# Patient Record
Sex: Male | Born: 1950
Health system: Southern US, Community
[De-identification: ages and names within clinical notes are randomized; demographics above are authoritative.]

## PROBLEM LIST (undated history)

## (undated) DIAGNOSIS — I10 Essential (primary) hypertension: Secondary | ICD-10-CM

## (undated) DIAGNOSIS — R51 Headache: Secondary | ICD-10-CM

## (undated) DIAGNOSIS — S83209A Unspecified tear of unspecified meniscus, current injury, unspecified knee, initial encounter: Secondary | ICD-10-CM

## (undated) HISTORY — PX: DG BIOPSY LUNG: HXRAD146

---

## 2002-09-13 ENCOUNTER — Ambulatory Visit (HOSPITAL_COMMUNITY): Admission: RE | Admit: 2002-09-13 | Discharge: 2002-09-13 | Payer: Self-pay | Admitting: General Surgery

## 2008-05-26 ENCOUNTER — Emergency Department (HOSPITAL_COMMUNITY): Admission: EM | Admit: 2008-05-26 | Discharge: 2008-05-26 | Payer: Self-pay | Admitting: Emergency Medicine

## 2011-04-22 NOTE — H&P (Signed)
   NAME:  Luis Munoz, Luis L.                      ACCOUNT NO.:  [id number]   MEDICAL RECORD NO.:  [id number]                  PATIENT TYPE:   LOCATION:                                       FACILITY:  [facility]   PHYSICIAN:  Jerolyn Shin C. Katrinka Blazing, M.D.                DATE OF BIRTH:   DATE OF ADMISSION:  DATE OF DISCHARGE:                                HISTORY & PHYSICAL   HISTORY OF PRESENT ILLNESS:  [age]-year-old male with history of rectal  bleeding.  He has had four episodes of rectal bleeding over the past two  weeks.  He has no pain with bowel movements.  He has no abdominal pain.  He  denies nausea or vomiting.  He is scheduled for a colonoscopy.   PAST HISTORY:  He has a history of T-cell lymphoma which was diagnosed in  69.  He underwent chemotherapy and has been totally recovered since that  time.  He has no other medical illness.   ALLERGIES:  He has no known drug allergies.   FAMILY HISTORY:  Family history is positive for hypertension and diabetes.   PHYSICAL EXAMINATION:  VITAL SIGNS:  Blood pressure 133/84, pulse 78,  respirations 18.  Weight 262 pounds.  HEENT:  Unremarkable.  NECK:  Neck supple without JVD or bruit.  No thyromegaly, adenopathy or  jugular venous distention.  CHEST:  Chest clear to auscultation.  No rales, rubs, rhonchi or wheezes.  HEART:  Regular rate and rhythm without murmur, gallop or rub.  ABDOMEN:  Abdomen is soft, nontender, no masses.  RECTAL:  Normal rectal exam.  No masses.  Stool is guaiac-positive.  EXTREMITIES:  No cyanosis, clubbing or edema.  NEUROLOGIC:  No focal motor, sensory or cerebellar deficits.  LYMPHATIC:  Lymph node exam reveals no lymph nodes in the neck,  supraclavicular area, submental area, axillae, inguinal, epitrochlear or  popliteal areas.   IMPRESSION:  1. Recurrent rectal bleeding.  2. Prior history of T-cell lymphoma, recovered.   PLAN:  Colonoscopy.                                               Dirk Dress.  Katrinka Blazing, M.D.    LCS/MEDQ  D:  09/12/2002  T:  09/13/2002  Job:  [id number]

## 2011-09-01 LAB — DIFFERENTIAL
Basophils Absolute: 0.1
Basophils Relative: 0
Eosinophils Absolute: 0
Monocytes Relative: 3
Neutro Abs: 12.3 — ABNORMAL HIGH
Neutrophils Relative %: 86 — ABNORMAL HIGH

## 2011-09-01 LAB — BASIC METABOLIC PANEL
BUN: 6
CO2: 26
Calcium: 9.2
Chloride: 104
Creatinine, Ser: 0.99
GFR calc Af Amer: >60

## 2011-09-01 LAB — POCT CARDIAC MARKERS
CKMB, poc: 2.7
Myoglobin, poc: 76.3
Operator id: 267321

## 2011-09-01 LAB — CBC
MCHC: 33.9
MCV: 90.5
Platelets: 279
RBC: 4.68
RDW: 12.7

## 2011-10-14 ENCOUNTER — Other Ambulatory Visit: Payer: Self-pay | Admitting: Orthopedic Surgery

## 2011-10-14 DIAGNOSIS — S83249A Other tear of medial meniscus, current injury, unspecified knee, initial encounter: Secondary | ICD-10-CM | POA: Insufficient documentation

## 2011-10-17 ENCOUNTER — Other Ambulatory Visit: Payer: Self-pay | Admitting: Orthopedic Surgery

## 2011-10-17 ENCOUNTER — Encounter (HOSPITAL_BASED_OUTPATIENT_CLINIC_OR_DEPARTMENT_OTHER): Payer: Self-pay | Admitting: *Deleted

## 2011-10-17 NOTE — H&P (Signed)
  Luis Munoz 10/11/2011 9:00 AM Location: [facility]  DOB: 09/20/1961 Married / Language: Undefined / Race: White Male   History of Present Illness The patient is a 60 year old male who presents today for follow up of their knee. The patient is being followed for their left knee pain. They are now 3 month(s) out from when symptoms began. Symptoms reported today include: pain, swelling, aching and stiffness. The patient feels that they are doing well and report their pain level to be moderate. The patient presents today following MRI. The patient has reported improvement of their symptoms with: rest.    Problem List/Past Medical Acute Medial Meniscal Tear (836.0) Pain in joint, lower leg (719.46). 05/13/2011 Osteoarthrosis NOS, lower leg (715.96). 05/13/2011   Allergies; No Known Drug Allergies. 09/27/2011   Social History Tobacco use. occasionally No alcohol use   Medication History; 10/11/2011 9:00 AM) Norco (5-325MG  Tablet, 1 (one) Oral every 4-6 hours as needed for pain, Taken starting 10/06/2011) Active. Niaspan (1000MG  Tablet ER, Oral) Active. Losartan Potassium-HCTZ (50-12.5MG  Tablet, Oral) Active. Vimovo (500-20MG  Tablet DR, Oral) Active. Dolgic Plus ( Oral) Specific dose unknown - Active. Claritin (10MG  Capsule, Oral) Active.   Other Problems) Hypertension   Objective Transcription(Luis Munoz A Luis Shultis, MD; 10/13/2011 8:48 AM)  On physical exam of his knee, he still has pain. He has a genu varus. He has minimal knee effusion. The popliteal space is fine. Cruciate and collateral ligaments are intact. Patella tracks well midline. He has painful knee. Calf soft and nontender. No phlebitis. Circulation intact.    RADIOGRAPHS:  I reviewed the MRI, reviewed the report.    Showed them the knee model and went over all possible complications such as infection, blood clots, etc. He will be on antibiotics right before surgery when  we bring him into outpatient. He is going to buy a bottle of aspirin 325 mg and he will take two for two weeks after surgery as a blood thinner. He will be on crutches partial weightbearing.      Assessment & Plan(Luis Munoz A Luis Otten, MD; 10/11/2011 9:18 AM) Acute Medial Meniscal Tear (836.0) Current Plans l Follow up as needed    Plans Transcription(Luis Munoz A Luis Treloar, MD; 10/13/2011 8:48 AM)  The patient needs a left knee arthroscopic medial and lateral meniscectomy. He also may need to do a clean out, but we will decide that at the time of surgery. I discussed the arthritis with him and explained that we may not be able to give him total pain relief, but we will not known that until we look into the knee joint. We will do it as an outpatient.      Miscellaneous Transcription(Luis Munoz A Luis Sakamoto, MD; 10/13/2011 8:48 AM)  Luis Cones, MD/sr

## 2011-10-17 NOTE — Progress Notes (Signed)
LM on cell [id number]. NPO after MN. Pt to arrive at 1030. Will need hx done. Poss. ekg and istat.

## 2011-10-18 ENCOUNTER — Encounter (HOSPITAL_BASED_OUTPATIENT_CLINIC_OR_DEPARTMENT_OTHER): Payer: Self-pay | Admitting: Anesthesiology

## 2011-10-18 ENCOUNTER — Ambulatory Visit (HOSPITAL_BASED_OUTPATIENT_CLINIC_OR_DEPARTMENT_OTHER): Payer: BC Managed Care – PPO | Admitting: Anesthesiology

## 2011-10-18 ENCOUNTER — Ambulatory Visit (HOSPITAL_BASED_OUTPATIENT_CLINIC_OR_DEPARTMENT_OTHER)
Admission: RE | Admit: 2011-10-18 | Discharge: 2011-10-18 | Disposition: A | Discharge status: Home / Self Care | Payer: BC Managed Care – PPO | Source: Ambulatory Visit | Attending: Orthopedic Surgery | Admitting: Orthopedic Surgery

## 2011-10-18 ENCOUNTER — Other Ambulatory Visit: Payer: Self-pay

## 2011-10-18 ENCOUNTER — Encounter (HOSPITAL_BASED_OUTPATIENT_CLINIC_OR_DEPARTMENT_OTHER)
Admission: RE | Disposition: A | Discharge status: Home / Self Care | Payer: Self-pay | Source: Ambulatory Visit | Attending: Orthopedic Surgery

## 2011-10-18 ENCOUNTER — Encounter (HOSPITAL_BASED_OUTPATIENT_CLINIC_OR_DEPARTMENT_OTHER): Payer: Self-pay | Admitting: *Deleted

## 2011-10-18 ENCOUNTER — Ambulatory Visit (HOSPITAL_COMMUNITY): Payer: BC Managed Care – PPO

## 2011-10-18 DIAGNOSIS — X58XXXA Exposure to other specified factors, initial encounter: Secondary | ICD-10-CM | POA: Insufficient documentation

## 2011-10-18 DIAGNOSIS — Z01818 Encounter for other preprocedural examination: Secondary | ICD-10-CM | POA: Insufficient documentation

## 2011-10-18 DIAGNOSIS — Z79899 Other long term (current) drug therapy: Secondary | ICD-10-CM | POA: Insufficient documentation

## 2011-10-18 DIAGNOSIS — S83289A Other tear of lateral meniscus, current injury, unspecified knee, initial encounter: Secondary | ICD-10-CM | POA: Insufficient documentation

## 2011-10-18 DIAGNOSIS — M1710 Unilateral primary osteoarthritis, unspecified knee: Secondary | ICD-10-CM | POA: Insufficient documentation

## 2011-10-18 DIAGNOSIS — I10 Essential (primary) hypertension: Secondary | ICD-10-CM | POA: Insufficient documentation

## 2011-10-18 DIAGNOSIS — IMO0002 Reserved for concepts with insufficient information to code with codable children: Secondary | ICD-10-CM | POA: Insufficient documentation

## 2011-10-18 DIAGNOSIS — M25569 Pain in unspecified knee: Secondary | ICD-10-CM | POA: Insufficient documentation

## 2011-10-18 HISTORY — DX: Unspecified tear of unspecified meniscus, current injury, unspecified knee, initial encounter: S83.209A

## 2011-10-18 HISTORY — DX: Essential (primary) hypertension: I10

## 2011-10-18 HISTORY — DX: Headache: R51

## 2011-10-18 LAB — DIFFERENTIAL
Eosinophils Absolute: 0.4 10*3/uL (ref 0.0–0.7)
Eosinophils Relative: 5 % (ref 0–5)
Lymphocytes Relative: 33 % (ref 12–46)
Lymphs Abs: 2.8 10*3/uL (ref 0.7–4.0)
Monocytes Relative: 5 % (ref 3–12)

## 2011-10-18 LAB — CBC
MCH: 29.7 pg (ref 26.0–34.0)
MCHC: 32.8 g/dL (ref 30.0–36.0)
MCV: 90.3 fL (ref 78.0–100.0)
Platelets: 291 10*3/uL (ref 150–400)
RBC: 4.35 MIL/uL (ref 4.22–5.81)
RDW: 12.8 % (ref 11.5–15.5)

## 2011-10-18 LAB — COMPREHENSIVE METABOLIC PANEL
Alkaline Phosphatase: 75 U/L (ref 39–117)
BUN: 8 mg/dL (ref 6–23)
CO2: 28 mEq/L (ref 19–32)
Calcium: 9.2 mg/dL (ref 8.4–10.5)
GFR calc Af Amer: >90 mL/min (ref >90)
GFR calc non Af Amer: 89 mL/min — ABNORMAL LOW (ref >90)
Glucose, Bld: 110 mg/dL — ABNORMAL HIGH (ref 70–99)
Potassium: 4 mEq/L (ref 3.5–5.1)
Total Protein: 7.6 g/dL (ref 6.0–8.3)

## 2011-10-18 LAB — URINALYSIS, ROUTINE W REFLEX MICROSCOPIC
Ketones, ur: NEGATIVE mg/dL
Leukocytes, UA: NEGATIVE
Nitrite: NEGATIVE
Urobilinogen, UA: 1 mg/dL (ref 0.0–1.0)
pH: 6.5 (ref 5.0–8.0)

## 2011-10-18 SURGERY — ARTHROSCOPY, KNEE, WITH MEDIAL MENISCECTOMY
Anesthesia: General | Site: Knee | Laterality: Left | Wound class: Clean

## 2011-10-18 MED ORDER — SODIUM CHLORIDE 0.9 % IJ SOLN: 3.0000 mL | INTRAMUSCULAR | Status: DC | PRN

## 2011-10-18 MED ORDER — LIDOCAINE HCL (CARDIAC) 20 MG/ML IV SOLN
INTRAVENOUS | Status: DC | PRN
  Administered 2011-10-18: 100 mg via INTRAVENOUS

## 2011-10-18 MED ORDER — ASPIRIN 325 MG PO TABS: 325.0000 mg | ORAL_TABLET | Freq: Two times a day (BID) | ORAL | Status: AC

## 2011-10-18 MED ORDER — BUPIVACAINE-EPINEPHRINE 0.25% -1:200000 IJ SOLN
INTRAMUSCULAR | Status: DC | PRN
  Administered 2011-10-18: 30 mL

## 2011-10-18 MED ORDER — OXYCODONE-ACETAMINOPHEN 10-325 MG PO TABS: 1.0000 | ORAL_TABLET | ORAL | Status: AC | PRN

## 2011-10-18 MED ORDER — PROPOFOL 10 MG/ML IV EMUL
INTRAVENOUS | Status: DC | PRN
  Administered 2011-10-18: 200 mg via INTRAVENOUS

## 2011-10-18 MED ORDER — PROMETHAZINE HCL 25 MG/ML IJ SOLN: 6.2500 mg | INTRAMUSCULAR | Status: DC | PRN

## 2011-10-18 MED ORDER — SODIUM CHLORIDE 0.9 % IR SOLN
Status: DC | PRN
  Administered 2011-10-18: 3

## 2011-10-18 MED ORDER — ASPIRIN 325 MG PO TABS: 325.0000 mg | ORAL_TABLET | Freq: Two times a day (BID) | ORAL | Status: DC

## 2011-10-18 MED ORDER — RINGERS IV SOLN: INTRAVENOUS | Status: DC

## 2011-10-18 MED ORDER — SODIUM CHLORIDE 0.9 % IJ SOLN: 3.0000 mL | Freq: Two times a day (BID) | INTRAMUSCULAR | Status: DC

## 2011-10-18 MED ORDER — CEFAZOLIN SODIUM 1-5 GM-% IV SOLN
1.0000 g | INTRAVENOUS | Status: AC
  Administered 2011-10-18: 2 g via INTRAVENOUS

## 2011-10-18 MED ORDER — ONDANSETRON HCL 4 MG/2ML IJ SOLN
INTRAMUSCULAR | Status: DC | PRN
  Administered 2011-10-18: 4 mg via INTRAVENOUS

## 2011-10-18 MED ORDER — SODIUM CHLORIDE 0.9 % IV SOLN: 250.0000 mL | INTRAVENOUS | Status: DC

## 2011-10-18 MED ORDER — MIDAZOLAM HCL 5 MG/5ML IJ SOLN
INTRAMUSCULAR | Status: DC | PRN
  Administered 2011-10-18: 2 mg via INTRAVENOUS

## 2011-10-18 MED ORDER — FENTANYL CITRATE 0.05 MG/ML IJ SOLN
25.0000 ug | INTRAMUSCULAR | Status: DC | PRN
  Administered 2011-10-18 (×2): 50 ug via INTRAVENOUS

## 2011-10-18 MED ORDER — BACITRACIN-NEOMYCIN-POLYMYXIN 400-5-5000 EX OINT
TOPICAL_OINTMENT | CUTANEOUS | Status: DC | PRN
  Administered 2011-10-18: 1 via TOPICAL

## 2011-10-18 MED ORDER — LACTATED RINGERS IV SOLN
INTRAVENOUS | Status: DC
  Administered 2011-10-18: 12:00:00 via INTRAVENOUS

## 2011-10-18 MED ORDER — DEXAMETHASONE SODIUM PHOSPHATE 4 MG/ML IJ SOLN
INTRAMUSCULAR | Status: DC | PRN
  Administered 2011-10-18: 8 mg via INTRAVENOUS

## 2011-10-18 MED ORDER — FENTANYL CITRATE 0.05 MG/ML IJ SOLN
INTRAMUSCULAR | Status: DC | PRN
  Administered 2011-10-18 (×2): 25 ug via INTRAVENOUS
  Administered 2011-10-18 (×3): 50 ug via INTRAVENOUS

## 2011-10-18 MED ORDER — OXYCODONE-ACETAMINOPHEN 5-325 MG PO TABS
1.0000 | ORAL_TABLET | ORAL | Status: DC | PRN
  Administered 2011-10-18: 1 via ORAL

## 2011-10-18 MED ORDER — LACTATED RINGERS IV SOLN: INTRAVENOUS | Status: DC

## 2011-10-18 SURGICAL SUPPLY — 43 items
BANDAGE ACE 4 STERILE (GAUZE/BANDAGES/DRESSINGS) ×1 IMPLANT
BANDAGE ELASTIC 6 VELCRO ST LF (GAUZE/BANDAGES/DRESSINGS) ×2 IMPLANT
BLADE CUDA 5.5 (BLADE) IMPLANT
BLADE CUTTER GATOR 3.5 (BLADE) IMPLANT
BLADE CUTTER MENIS 5.5 (BLADE) IMPLANT
BLADE GREAT WHITE 4.2 (BLADE) ×2 IMPLANT
BLADE SURG 15 STRL LF DISP TIS (BLADE) IMPLANT
BLADE SURG 15 STRL SS (BLADE) ×2
BNDG COHESIVE 6X5 TAN NS LF (GAUZE/BANDAGES/DRESSINGS) ×2 IMPLANT
CANISTER SUCT LVC 12 LTR MEDI- (MISCELLANEOUS) ×2 IMPLANT
CANISTER SUCTION 2500CC (MISCELLANEOUS) ×1 IMPLANT
CLOTH BEACON ORANGE TIMEOUT ST (SAFETY) ×2 IMPLANT
DRAPE ARTHROSCOPY W/POUCH 114 (DRAPES) ×2 IMPLANT
DRAPE LG THREE QUARTER DISP (DRAPES) ×2 IMPLANT
DRSG EMULSION OIL 3X3 NADH (GAUZE/BANDAGES/DRESSINGS) ×2 IMPLANT
DRSG PAD ABDOMINAL 8X10 ST (GAUZE/BANDAGES/DRESSINGS) ×6 IMPLANT
DURAPREP 26ML APPLICATOR (WOUND CARE) ×2 IMPLANT
ELECT MENISCUS 165MM 90D (ELECTRODE) IMPLANT
ELECT REM PT RETURN 9FT ADLT (ELECTROSURGICAL)
ELECTRODE REM PT RTRN 9FT ADLT (ELECTROSURGICAL) IMPLANT
GELFOAM SMALL 12 7MM 315 3 (MISCELLANEOUS) IMPLANT
GLOVE BIOGEL PI IND STRL 6.5 (GLOVE) IMPLANT
GLOVE BIOGEL PI INDICATOR 6.5 (GLOVE) ×2
GLOVE ECLIPSE 8.0 STRL XLNG CF (GLOVE) ×2 IMPLANT
GLOVE INDICATOR 8.5 STRL (GLOVE) ×4 IMPLANT
GLOVE SURG ORTHO 6.0 STRL STRW (GLOVE) ×2 IMPLANT
GOWN W/COTTON TOWEL STD LRG (GOWNS) ×2 IMPLANT
GOWN XL W/COTTON TOWEL STD (GOWNS) ×3 IMPLANT
IV NS IRRIG 3000ML ARTHROMATIC (IV SOLUTION) ×3 IMPLANT
KNEE WRAP E Z 3 GEL PACK (MISCELLANEOUS) ×2 IMPLANT
MINI VAC (SURGICAL WAND) ×2 IMPLANT
PACK ARTHROSCOPY DSU (CUSTOM PROCEDURE TRAY) ×2 IMPLANT
PACK BASIN DAY SURGERY FS (CUSTOM PROCEDURE TRAY) ×2 IMPLANT
PADDING CAST COTTON 6X4 STRL (CAST SUPPLIES) ×2 IMPLANT
PADDING WEBRIL 6 STERILE (GAUZE/BANDAGES/DRESSINGS) ×1 IMPLANT
PENCIL BUTTON HOLSTER BLD 10FT (ELECTRODE) IMPLANT
SET ARTHROSCOPY TUBING (MISCELLANEOUS) ×2
SET ARTHROSCOPY TUBING LN (MISCELLANEOUS) ×1 IMPLANT
SPONGE GAUZE 4X4 12PLY (GAUZE/BANDAGES/DRESSINGS) ×2 IMPLANT
SUT ETHILON 3 0 PS 1 (SUTURE) ×2 IMPLANT
SYR CONTROL 10ML LL (SYRINGE) ×1 IMPLANT
TOWEL OR 17X24 6PK STRL BLUE (TOWEL DISPOSABLE) ×2 IMPLANT
WATER STERILE IRR 500ML POUR (IV SOLUTION) ×3 IMPLANT

## 2011-10-18 NOTE — Anesthesia Postprocedure Evaluation (Signed)
Anesthesia Post Note  Patient: Luis Munoz  Procedure(s) Performed:  KNEE ARTHROSCOPY WITH MEDIAL MENISECTOMY - left knee scope with medial and lateral menisectomy , abrasion chondroplasty of patella and medial femoral condyle  Anesthesia type: General  Patient location: PACU  Post pain: Pain level controlled  Post assessment: Post-op Vital signs reviewed  Last Vitals:  Filed Vitals:   10/18/11 1500  BP: 131/70  Pulse: 80  Temp:   Resp: 10    Post vital signs: Reviewed  Level of consciousness: sedated  Complications: No apparent anesthesia complications

## 2011-10-18 NOTE — Anesthesia Procedure Notes (Signed)
Procedure Name: LMA Insertion Date/Time: 10/18/2011 12:55 PM Performed by: Renella Cunas D Pre-anesthesia Checklist: Patient identified, Emergency Drugs available, Suction available and Patient being monitored Patient Re-evaluated:Patient Re-evaluated prior to inductionOxygen Delivery Method: Circle System Utilized Preoxygenation: Pre-oxygenation with 100% oxygen Intubation Type: IV induction Ventilation: Mask ventilation without difficulty LMA: LMA inserted LMA Size: 4.0 Number of attempts: 1 Placement Confirmation: positive ETCO2 Tube secured with: Tape Dental Injury: Teeth and Oropharynx as per pre-operative assessment

## 2011-10-18 NOTE — Transfer of Care (Signed)
Immediate Anesthesia Transfer of Care Note  Patient: Luis Munoz  Procedure(s) Performed:  KNEE ARTHROSCOPY WITH MEDIAL MENISECTOMY - left knee scope with medial and lateral menisectomy , abrasion chondroplasty of patella and medial femoral condyle  Patient Location: PACU  Anesthesia Type: General  Level of Consciousness: awake, oriented, sedated and patient cooperative  Airway & Oxygen Therapy: Patient Spontanous Breathing and Patient connected to face mask oxygen  Post-op Assessment: Report given to PACU RN and Post -op Vital signs reviewed and stable  Post vital signs: Reviewed and stable  Complications: No apparent anesthesia complications

## 2011-10-18 NOTE — Anesthesia Preprocedure Evaluation (Addendum)
Anesthesia Evaluation  Patient identified by MRN, date of birth, ID band Patient awake, Patient confused and Patient unresponsive    History of Anesthesia Complications Negative for: history of anesthetic complications  Airway Mallampati: II TM Distance: >3 FB     Dental No notable dental hx. (+) Teeth Intact and Dental Advisory Given   Pulmonary neg pulmonary ROS,  clear to auscultation  Pulmonary exam normal       Cardiovascular hypertension, Pt. on medications Regular Normal    Neuro/Psych Negative Neurological ROS     GI/Hepatic negative GI ROS, Neg liver ROS,   Endo/Other  Morbid obesity  Renal/GU negative Renal ROS     Musculoskeletal negative musculoskeletal ROS (+)   Abdominal   Peds  Hematology negative hematology ROS (+)   Anesthesia Other Findings   Reproductive/Obstetrics negative OB ROS                           Anesthesia Physical Anesthesia Plan  ASA: III  Anesthesia Plan: General   Post-op Pain Management:    Induction:   Airway Management Planned: LMA  Additional Equipment:   Intra-op Plan:   Post-operative Plan:   Informed Consent: I have reviewed the patients History and Physical, chart, labs and discussed the procedure including the risks, benefits and alternatives for the proposed anesthesia with the patient or authorized representative who has indicated his/her understanding and acceptance.   Dental advisory given  Plan Discussed with: CRNA  Anesthesia Plan Comments:         Anesthesia Quick Evaluation

## 2011-10-18 NOTE — Brief Op Note (Signed)
10/18/2011  1:40 PM  PATIENT:  Tecumseh L Minchey  60 y.o. male  PRE-OPERATIVE DIAGNOSIS:  left knee lateral and medial meniscal tear  POST-OPERATIVE DIAGNOSIS:  left knee lateral and medial meniscal tear  PROCEDURE:  Procedure(s): KNEE ARTHROSCOPY WITH MEDIAL MENISECTOMY  SURGEON:  Surgeon(s): Adib Wahba A Jaysha Lasure  PHYSICIAN ASSISTANT:   ASSISTANTS: None   ANESTHESIA:   general  EBL:  Total I/O In: 200 [I.V.:200] Out: -   BLOOD ADMINISTERED:none  DRAINS: none   LOCAL MEDICATIONS USED:  MARCAINE 30CC,0.25%  SPECIMEN:  No Specimen  DISPOSITION OF SPECIMEN:  N/A  COUNTS:  YES  TOURNIQUET:  * No tourniquets in log *  DICTATION: .Other Dictation: Dictation Number [id number]  PLAN OF CARE: Discharge to home after PACU  PATIENT DISPOSITION:  PACU - hemodynamically stable.

## 2011-10-18 NOTE — Interval H&P Note (Signed)
History and Physical Interval Note:   10/18/2011   12:42 PM   Luis Munoz  has presented today for surgery, with the diagnosis of left knee lateral and medial meniscal tear  The various methods of treatment have been discussed with the patient and family. After consideration of risks, benefits and other options for treatment, the patient has consented to  Procedure(s): KNEE ARTHROSCOPY WITH MEDIAL MENISECTOMY as a surgical intervention .  The patients' history has been reviewed, patient examined, no change in status, stable for surgery.  I have reviewed the patients' chart and labs.  Questions were answered to the patient's satisfaction.     [name]  MD

## 2011-10-19 NOTE — Op Note (Signed)
NAME:  Luis Munoz, Luis Munoz                    ACCOUNT NO.:  MEDICAL RECORD NO.:  [id number]  LOCATION:                                 FACILITY:  PHYSICIAN:  Zavian Slowey A. Calhoun Reichardt, M.D.DATE OF BIRTH:  1951-04-08  DATE OF PROCEDURE:  10/18/2011 DATE OF DISCHARGE:                              OPERATIVE REPORT   PREOPERATIVE DIAGNOSES: 1. Degenerative arthritis, left knee. 2. Torn medial meniscus, left knee. 3. Torn lateral meniscus, left knee.  POSTOPERATIVE DIAGNOSES: 1. Degenerative arthritis, left knee. 2. Torn medial meniscus, left knee. 3. Torn lateral meniscus, left knee.  SURGEON:  Georges Lynch. Darrelyn Hillock, M.D.  ASSISTANT:  Nurse.  OPERATION: 1. Diagnostic arthroscopy, left knee. 2. Medial meniscectomy, left knee. 3. Lateral meniscectomy, left knee. 4. Abrasion chondroplasty, medial femoral condyle, left knee. 5. Abrasion chondroplasty patella, left knee. 6. Synovectomy, suprapatellar pouch, left knee.  PROCEDURE IN DETAIL:  Under general anesthesia, routine orthopedic prep and draping of the left lower extremity was carried out with the patient's leg in a leg holder.  He had 2 g of IV Ancef.  At this time, the appropriate time-out was carried out in the operating room.  Prior to that, I marked the appropriate left leg in the holding area.  At this time, a small punctate incision was made in the superior lateral joint. Inflow cannula was inserted.  Knee was distended with saline.  At this time, a small punctate incision was made in the anterolateral joint. The arthroscope was entered from lateral approach and a complete diagnostic arthroscopy was carried out.  I then made an incision in the medial joint.  The ArthroCare was entered from the medial approach.  I noted he had severe arthritic changes in the medial joint.  I first did an abrasion chondroplasty, medial femoral condyle.  I went back posterior.  He had a severe complex tear of the medial meniscus.  I utilized the  [employer] and did a medial meniscectomy, followed up by the shaver suction device.  After this, I then went over to the lateral joint.  He had a small tear of the posterior medial aspect of the lateral meniscus.  I introduced the ArthroCare and did a partial meniscectomy.  At that particular time, I went over and in the lateral joint, further inspected that there were no other problems.  I went up into the suprapatellar pouch, did an abrasion chondroplasty of the patella, followed by a synovectomy.  I thoroughly irrigated out the knee, removed all the fluid, closed all 3 punctate incisions with 3-0 nylon suture.  I injected 30 mL of 0.25% Marcaine with epinephrine in the knee joint.  A sterile Neosporin dressing was applied.  The patient left the operative room in satisfactory condition.  FOLLOWUP CARE: 1. He will be on aspirin 325 mg b.i.d. for 2 weeks starting today as     an anticoagulant. 2. He will be followed in the office 10-12 days or prior to that. 3. He will be on a walker or crutches whatever he prefers partial full     weightbearing as tolerated.          ______________________________ Georges Lynch. Darrelyn Hillock, M.D.  RAG/MEDQ  D:  10/18/2011  T:  10/18/2011  Job:  [id number]

## 2011-11-18 ENCOUNTER — Encounter (HOSPITAL_BASED_OUTPATIENT_CLINIC_OR_DEPARTMENT_OTHER): Payer: Self-pay

## 2011-11-23 ENCOUNTER — Encounter (HOSPITAL_BASED_OUTPATIENT_CLINIC_OR_DEPARTMENT_OTHER): Payer: Self-pay

## 2012-01-11 NOTE — Addendum Note (Signed)
Addendum  created 01/11/12 1336 by Einar Pheasant, MD   Modules edited:Anesthesia Events, Anesthesia Responsible Staff

## 2012-01-11 NOTE — Addendum Note (Signed)
Addendum  created 01/11/12 1336 by Tiras Bianchini F Alizandra Loh, MD   Modules edited:Anesthesia Events, Anesthesia Responsible Staff    

## 2015-10-05 ENCOUNTER — Encounter (HOSPITAL_COMMUNITY): Payer: Self-pay | Admitting: Emergency Medicine

## 2015-10-05 ENCOUNTER — Emergency Department (HOSPITAL_COMMUNITY)
Admission: EM | Admit: 2015-10-05 | Discharge: 2015-10-05 | Disposition: A | Discharge status: Home / Self Care | Payer: 59 | Attending: Emergency Medicine | Admitting: Emergency Medicine

## 2015-10-05 ENCOUNTER — Emergency Department (HOSPITAL_COMMUNITY): Payer: 59

## 2015-10-05 DIAGNOSIS — S20211A Contusion of right front wall of thorax, initial encounter: Secondary | ICD-10-CM | POA: Insufficient documentation

## 2015-10-05 DIAGNOSIS — S199XXA Unspecified injury of neck, initial encounter: Secondary | ICD-10-CM | POA: Diagnosis present

## 2015-10-05 DIAGNOSIS — I10 Essential (primary) hypertension: Secondary | ICD-10-CM | POA: Diagnosis not present

## 2015-10-05 DIAGNOSIS — Z79899 Other long term (current) drug therapy: Secondary | ICD-10-CM | POA: Diagnosis not present

## 2015-10-05 DIAGNOSIS — Z87891 Personal history of nicotine dependence: Secondary | ICD-10-CM | POA: Diagnosis not present

## 2015-10-05 DIAGNOSIS — S161XXA Strain of muscle, fascia and tendon at neck level, initial encounter: Secondary | ICD-10-CM | POA: Insufficient documentation

## 2015-10-05 DIAGNOSIS — Y998 Other external cause status: Secondary | ICD-10-CM | POA: Insufficient documentation

## 2015-10-05 DIAGNOSIS — S3992XA Unspecified injury of lower back, initial encounter: Secondary | ICD-10-CM | POA: Insufficient documentation

## 2015-10-05 DIAGNOSIS — Y9389 Activity, other specified: Secondary | ICD-10-CM | POA: Insufficient documentation

## 2015-10-05 DIAGNOSIS — Z791 Long term (current) use of non-steroidal anti-inflammatories (NSAID): Secondary | ICD-10-CM | POA: Insufficient documentation

## 2015-10-05 DIAGNOSIS — Y92410 Unspecified street and highway as the place of occurrence of the external cause: Secondary | ICD-10-CM | POA: Diagnosis not present

## 2015-10-05 DIAGNOSIS — Z87828 Personal history of other (healed) physical injury and trauma: Secondary | ICD-10-CM | POA: Diagnosis not present

## 2015-10-05 MED ORDER — TRAMADOL HCL 50 MG PO TABS
50.0000 mg | ORAL_TABLET | Freq: Once | ORAL | Status: AC
  Administered 2015-10-05: 50 mg via ORAL
  Filled 2015-10-05: qty 1

## 2015-10-05 MED ORDER — METHOCARBAMOL 500 MG PO TABS: 500.0000 mg | ORAL_TABLET | Freq: Three times a day (TID) | ORAL | Status: AC

## 2015-10-05 NOTE — Discharge Instructions (Signed)
Cervical Sprain A cervical sprain is when the tissues (ligaments) that hold the neck bones in place stretch or tear. HOME CARE   Put ice on the injured area.  Put ice in a plastic bag.  Place a towel between your skin and the bag.  Leave the ice on for 15-20 minutes, 3-4 times a day.  You may have been given a collar to wear. This collar keeps your neck from moving while you heal.  Do not take the collar off unless told by your doctor.  If you have long hair, keep it outside of the collar.  Ask your doctor before changing the position of your collar. You may need to change its position over time to make it more comfortable.  If you are allowed to take off the collar for cleaning or bathing, follow your doctor's instructions on how to do it safely.  Keep your collar clean by wiping it with mild soap and water. Dry it completely. If the collar has removable pads, remove them every 1-2 days to hand wash them with soap and water. Allow them to air dry. They should be dry before you wear them in the collar.  Do not drive while wearing the collar.  Only take medicine as told by your doctor.  Keep all doctor visits as told.  Keep all physical therapy visits as told.  Adjust your work station so that you have good posture while you work.  Avoid positions and activities that make your problems worse.  Warm up and stretch before being active. GET HELP IF:  Your pain is not controlled with medicine.  You cannot take less pain medicine over time as planned.  Your activity level does not improve as expected. GET HELP RIGHT AWAY IF:   You are bleeding.  Your stomach is upset.  You have an allergic reaction to your medicine.  You develop new problems that you cannot explain.  You lose feeling (become numb) or you cannot move any part of your body (paralysis).  You have tingling or weakness in any part of your body.  Your symptoms get worse. Symptoms include:  Pain,  soreness, stiffness, puffiness (swelling), or a burning feeling in your neck.  Pain when your neck is touched.  Shoulder or upper back pain.  Limited ability to move your neck.  Headache.  Dizziness.  Your hands or arms feel week, lose feeling, or tingle.  Muscle spasms.  Difficulty swallowing or chewing. MAKE SURE YOU:   Understand these instructions.  Will watch your condition.  Will get help right away if you are not doing well or get worse.   This information is not intended to replace advice given to you by your health care provider. Make sure you discuss any questions you have with your health care provider.   Document Released: 05/09/2008 Document Revised: 07/24/2013 Document Reviewed: 05/29/2013 Elsevier Interactive Patient Education 2016 [employer]  Chest Contusion A contusion is a deep bruise. Bruises happen when an injury causes bleeding under the skin. Signs of bruising include pain, puffiness (swelling), and discolored skin. The bruise may turn blue, purple, or yellow.  HOME CARE  Put ice on the injured area.  Put ice in a plastic bag.  Place a towel between the skin and the bag.  Leave the ice on for 15-20 minutes at a time, 03-04 times a day for the first 48 hours.  Only take medicine as told by your doctor.  Rest.  Take deep breaths (deep-breathing  exercises) as told by your doctor.  Stop smoking if you smoke.  Do not lift objects over 5 pounds (2.3 kilograms) for 3 days or longer if told by your doctor. GET HELP RIGHT AWAY IF:   You have more bruising or puffiness.  You have pain that gets worse.  You have trouble breathing.  You are dizzy, weak, or pass out (faint).  You have blood in your pee (urine) or poop (stool).  You cough up or throw up (vomit) blood.  Your puffiness or pain is not helped with medicines. MAKE SURE YOU:   Understand these instructions.  Will watch your condition.  Will get help right away if you are  not doing well or get worse.   This information is not intended to replace advice given to you by your health care provider. Make sure you discuss any questions you have with your health care provider.   Document Released: 05/09/2008 Document Revised: 08/15/2012 Document Reviewed: 05/14/2012 Elsevier Interactive Patient Education 2016 [employer].  Tourist information centre manager After a car crash (motor vehicle collision), it is normal to have bruises and sore muscles. The first 24 hours usually feel the worst. After that, you will likely start to feel better each day. HOME CARE  Put ice on the injured area.  Put ice in a plastic bag.  Place a towel between your skin and the bag.  Leave the ice on for 15-20 minutes, 03-04 times a day.  Drink enough fluids to keep your pee (urine) clear or pale yellow.  Do not drink alcohol.  Take a warm shower or bath 1 or 2 times a day. This helps your sore muscles.  Return to activities as told by your doctor. Be careful when lifting. Lifting can make neck or back pain worse.  Only take medicine as told by your doctor. Do not use aspirin. GET HELP RIGHT AWAY IF:   Your arms or legs tingle, feel weak, or lose feeling (numbness).  You have headaches that do not get better with medicine.  You have neck pain, especially in the middle of the back of your neck.  You cannot control when you pee (urinate) or poop (bowel movement).  Pain is getting worse in any part of your body.  You are short of breath, dizzy, or pass out (faint).  You have chest pain.  You feel sick to your stomach (nauseous), throw up (vomit), or sweat.  You have belly (abdominal) pain that gets worse.  There is blood in your pee, poop, or throw up.  You have pain in your shoulder (shoulder strap areas).  Your problems are getting worse. MAKE SURE YOU:   Understand these instructions.  Will watch your condition.  Will get help right away if you are not doing well or  get worse.   This information is not intended to replace advice given to you by your health care provider. Make sure you discuss any questions you have with your health care provider.   Document Released: 05/09/2008 Document Revised: 02/13/2012 Document Reviewed: 04/20/2011 Elsevier Interactive Patient Education [employer]
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
## 2015-10-22 ENCOUNTER — Ambulatory Visit (HOSPITAL_COMMUNITY): Payer: No Typology Code available for payment source | Attending: Family Medicine

## 2015-10-22 DIAGNOSIS — M2560 Stiffness of unspecified joint, not elsewhere classified: Secondary | ICD-10-CM | POA: Diagnosis not present

## 2015-10-22 DIAGNOSIS — Z7409 Other reduced mobility: Secondary | ICD-10-CM | POA: Diagnosis present

## 2015-10-22 DIAGNOSIS — Z789 Other specified health status: Secondary | ICD-10-CM

## 2015-10-22 DIAGNOSIS — Z658 Other specified problems related to psychosocial circumstances: Secondary | ICD-10-CM | POA: Diagnosis present

## 2015-10-22 DIAGNOSIS — M5410 Radiculopathy, site unspecified: Secondary | ICD-10-CM | POA: Diagnosis present

## 2015-10-22 DIAGNOSIS — M2569 Stiffness of other specified joint, not elsewhere classified: Secondary | ICD-10-CM

## 2015-10-22 DIAGNOSIS — M25511 Pain in right shoulder: Secondary | ICD-10-CM | POA: Diagnosis present

## 2015-10-22 DIAGNOSIS — R6889 Other general symptoms and signs: Secondary | ICD-10-CM

## 2015-10-22 NOTE — Patient Instructions (Addendum)
Utilize repeated flexion in standing to resolve all symptoms while at home.
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
## 2015-11-02 ENCOUNTER — Ambulatory Visit (HOSPITAL_COMMUNITY): Payer: No Typology Code available for payment source | Admitting: Physical Therapy

## 2015-11-02 DIAGNOSIS — Z789 Other specified health status: Secondary | ICD-10-CM

## 2015-11-02 DIAGNOSIS — R6889 Other general symptoms and signs: Secondary | ICD-10-CM

## 2015-11-02 DIAGNOSIS — M25511 Pain in right shoulder: Secondary | ICD-10-CM

## 2015-11-02 DIAGNOSIS — M2560 Stiffness of unspecified joint, not elsewhere classified: Secondary | ICD-10-CM | POA: Diagnosis not present

## 2015-11-02 DIAGNOSIS — Z7409 Other reduced mobility: Secondary | ICD-10-CM
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
## 2015-11-02 NOTE — Patient Instructions (Signed)
Knee Extension (Sitting)   Keeping in good posture.  Stomach tight  Place __0__ pound weight on left ankle and straighten knee fully, lower slowly. Repeat _10___ times per set. Do _1___ sets per session. Do __2__ sessions per day.  http://orth.exer.us/732   Copyright  VHI. All rights reserved.  Knee Extension (Sitting)    Place _0___ pound weight on left ankle and straighten knee fully, lower slowly. Repeat __10__ times per set. Do __1__ sets per session. Do _2___ sessions per day. 10 http://orth.exer.us/732   Copyright  VHI. All rights reserved.  Knee-to-Chest Stretch: Unilateral    With hand behind right knee, pull knee in to chest until a comfortable stretch is felt in lower back and buttocks. Keep back relaxed. Hold 30____ seconds. Repeat ___3_ times per set. Do ___1_ sets per session. Do __2__ sessions per day.  http://orth.exer.us/126   Copyright  VHI. All rights reserved.  Stretching: Hamstring (Supine)    Supporting right thigh behind knee, slowly straighten knee until stretch is felt in back of thigh. Hold __30__ seconds. Repeat __3__ times per set. Do __1__ sets per session. Do 2____ sessions per day.  http://orth.exer.us/656   Copyright  VHI. All rights reserved.  Pelvic Tilt: Posterior - Legs Bent (Supine)    Tighten stomach and flatten back by rolling pelvis down. Hold __3__ seconds. Relax. Repeat _10___ times per set. Do _1___ sets per session. Do 2____ sessions per day.  http://orth.exer.us/202   Copyright  VHI. All rights reserved.  ROM: Abduction (Standing)    Bring arms straight out from sides and raise as high as possible without pain. Repeat __10__ times per set. Do 1____ sets per session. Do ___2_ sessions per day.  http://orth.exer.us/910   Copyright  VHI. All rights reserved.  ROM: Flexion (Standing)    Bring arms straight out in front and raise as high as possible without pain. Keep palms facing up. Repeat __10__ times per  set. Do _1___ sets per session. Do __2__ sessions per day.  http://orth.exer.us/908   Copyright  VHI. All rights reserved.  ROM: External / Internal Rotation - in Flexion (Standing)    With upper arms straight out in front and parallel to floor, keep elbows bent at right angles and rotate up then down as far as possible without pain. Repeat __10__ times per set. Do __1__ sets per session. Do ___2_ sessions per day.  http://orth.exer.us/914   Copyright  VHI. All rights reserved.

## 2015-11-05 ENCOUNTER — Ambulatory Visit (HOSPITAL_COMMUNITY): Payer: No Typology Code available for payment source | Attending: Family Medicine

## 2015-11-05 DIAGNOSIS — M2560 Stiffness of unspecified joint, not elsewhere classified: Secondary | ICD-10-CM | POA: Diagnosis present

## 2015-11-05 DIAGNOSIS — M25511 Pain in right shoulder: Secondary | ICD-10-CM | POA: Insufficient documentation

## 2015-11-05 DIAGNOSIS — Z658 Other specified problems related to psychosocial circumstances: Secondary | ICD-10-CM | POA: Diagnosis present

## 2015-11-05 DIAGNOSIS — Z7409 Other reduced mobility: Secondary | ICD-10-CM | POA: Diagnosis present

## 2015-11-05 DIAGNOSIS — M2569 Stiffness of other specified joint, not elsewhere classified: Secondary | ICD-10-CM

## 2015-11-05 DIAGNOSIS — R6889 Other general symptoms and signs: Secondary | ICD-10-CM

## 2015-11-05 DIAGNOSIS — M5410 Radiculopathy, site unspecified: Secondary | ICD-10-CM | POA: Diagnosis not present

## 2015-11-05 DIAGNOSIS — Z789 Other specified health status: Secondary | ICD-10-CM
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
## 2015-11-10 ENCOUNTER — Telehealth (HOSPITAL_COMMUNITY): Payer: Self-pay | Admitting: Physical Therapy

## 2015-11-10 ENCOUNTER — Ambulatory Visit (HOSPITAL_COMMUNITY): Payer: No Typology Code available for payment source | Admitting: Physical Therapy

## 2015-11-10 DIAGNOSIS — M2560 Stiffness of unspecified joint, not elsewhere classified: Secondary | ICD-10-CM

## 2015-11-10 DIAGNOSIS — M5410 Radiculopathy, site unspecified: Secondary | ICD-10-CM

## 2015-11-10 DIAGNOSIS — M2569 Stiffness of other specified joint, not elsewhere classified: Secondary | ICD-10-CM

## 2015-11-10 DIAGNOSIS — R6889 Other general symptoms and signs: Secondary | ICD-10-CM

## 2015-11-10 DIAGNOSIS — M25511 Pain in right shoulder: Secondary | ICD-10-CM

## 2015-11-10 DIAGNOSIS — Z789 Other specified health status: Secondary | ICD-10-CM

## 2015-11-10 DIAGNOSIS — Z7409 Other reduced mobility: Secondary | ICD-10-CM
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
## 2015-11-10 NOTE — Telephone Encounter (Signed)
His wife will have surgery and he will be with her. NF 11/10/2015

## 2015-11-12 ENCOUNTER — Encounter (HOSPITAL_COMMUNITY): Payer: 59 | Admitting: Physical Therapy

## 2015-11-17 ENCOUNTER — Ambulatory Visit (HOSPITAL_COMMUNITY): Payer: No Typology Code available for payment source

## 2015-11-19 ENCOUNTER — Ambulatory Visit (HOSPITAL_COMMUNITY): Payer: No Typology Code available for payment source | Admitting: Physical Therapy

## 2015-11-19 DIAGNOSIS — Z7409 Other reduced mobility: Secondary | ICD-10-CM

## 2015-11-19 DIAGNOSIS — M5410 Radiculopathy, site unspecified: Secondary | ICD-10-CM

## 2015-11-19 DIAGNOSIS — M25511 Pain in right shoulder: Secondary | ICD-10-CM

## 2015-11-19 DIAGNOSIS — M2560 Stiffness of unspecified joint, not elsewhere classified: Secondary | ICD-10-CM

## 2015-11-19 DIAGNOSIS — M2569 Stiffness of other specified joint, not elsewhere classified: Secondary | ICD-10-CM
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
## 2015-11-24 ENCOUNTER — Ambulatory Visit (HOSPITAL_COMMUNITY): Payer: No Typology Code available for payment source | Admitting: Physical Therapy

## 2015-11-24 ENCOUNTER — Telehealth (HOSPITAL_COMMUNITY): Payer: Self-pay | Admitting: Physical Therapy

## 2015-11-24 DIAGNOSIS — Z789 Other specified health status: Secondary | ICD-10-CM

## 2015-11-24 DIAGNOSIS — R6889 Other general symptoms and signs: Secondary | ICD-10-CM

## 2015-11-24 DIAGNOSIS — M5410 Radiculopathy, site unspecified: Secondary | ICD-10-CM

## 2015-11-24 DIAGNOSIS — Z7409 Other reduced mobility: Secondary | ICD-10-CM

## 2015-11-24 DIAGNOSIS — M2569 Stiffness of other specified joint, not elsewhere classified: Secondary | ICD-10-CM

## 2015-11-24 DIAGNOSIS — M2560 Stiffness of unspecified joint, not elsewhere classified: Secondary | ICD-10-CM

## 2015-11-24 DIAGNOSIS — M25511 Pain in right shoulder: Secondary | ICD-10-CM
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
## 2015-11-26 ENCOUNTER — Ambulatory Visit (HOSPITAL_COMMUNITY): Payer: No Typology Code available for payment source | Admitting: Physical Therapy

## 2015-11-26 DIAGNOSIS — Z789 Other specified health status: Secondary | ICD-10-CM

## 2015-11-26 DIAGNOSIS — Z7409 Other reduced mobility: Secondary | ICD-10-CM

## 2015-11-26 DIAGNOSIS — M5410 Radiculopathy, site unspecified: Secondary | ICD-10-CM | POA: Diagnosis not present

## 2015-11-26 DIAGNOSIS — M2560 Stiffness of unspecified joint, not elsewhere classified: Secondary | ICD-10-CM

## 2015-11-26 DIAGNOSIS — M2569 Stiffness of other specified joint, not elsewhere classified: Secondary | ICD-10-CM

## 2015-11-26 DIAGNOSIS — M25511 Pain in right shoulder: Secondary | ICD-10-CM

## 2015-11-26 DIAGNOSIS — R6889 Other general symptoms and signs: Secondary | ICD-10-CM
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
## 2015-12-01 ENCOUNTER — Ambulatory Visit (HOSPITAL_COMMUNITY): Payer: No Typology Code available for payment source

## 2015-12-01 DIAGNOSIS — M5410 Radiculopathy, site unspecified: Secondary | ICD-10-CM

## 2015-12-01 DIAGNOSIS — M25511 Pain in right shoulder: Secondary | ICD-10-CM

## 2015-12-01 DIAGNOSIS — R6889 Other general symptoms and signs: Secondary | ICD-10-CM

## 2015-12-01 DIAGNOSIS — M2569 Stiffness of other specified joint, not elsewhere classified: Secondary | ICD-10-CM

## 2015-12-01 DIAGNOSIS — Z789 Other specified health status: Secondary | ICD-10-CM

## 2015-12-01 DIAGNOSIS — Z7409 Other reduced mobility: Secondary | ICD-10-CM

## 2015-12-01 DIAGNOSIS — M2560 Stiffness of unspecified joint, not elsewhere classified: Secondary | ICD-10-CM

## 2015-12-01 NOTE — Patient Instructions (Signed)
   Standing on R foot, move Left foot outward to side, keeping pelvis still. Maintain toes pointed inward. Perform 4 sets of 5-6.      Standing on L leg and maintaining pelvis in place, balance on Left foot for 3 seconds, 15 times.
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
## 2015-12-02 ENCOUNTER — Other Ambulatory Visit (HOSPITAL_COMMUNITY): Payer: Self-pay | Admitting: Family Medicine

## 2015-12-02 ENCOUNTER — Ambulatory Visit (HOSPITAL_COMMUNITY)
Admission: RE | Admit: 2015-12-02 | Discharge: 2015-12-02 | Disposition: A | Discharge status: Home / Self Care | Payer: 59 | Source: Ambulatory Visit | Attending: Family Medicine | Admitting: Family Medicine

## 2015-12-02 DIAGNOSIS — M5137 Other intervertebral disc degeneration, lumbosacral region: Secondary | ICD-10-CM | POA: Insufficient documentation

## 2015-12-02 DIAGNOSIS — M545 Low back pain: Secondary | ICD-10-CM

## 2015-12-02 DIAGNOSIS — M5136 Other intervertebral disc degeneration, lumbar region: Secondary | ICD-10-CM | POA: Diagnosis not present

## 2015-12-03 ENCOUNTER — Ambulatory Visit (HOSPITAL_COMMUNITY): Payer: No Typology Code available for payment source

## 2015-12-03 DIAGNOSIS — M2560 Stiffness of unspecified joint, not elsewhere classified: Secondary | ICD-10-CM

## 2015-12-03 DIAGNOSIS — M5410 Radiculopathy, site unspecified: Secondary | ICD-10-CM | POA: Diagnosis not present

## 2015-12-03 DIAGNOSIS — R6889 Other general symptoms and signs: Secondary | ICD-10-CM

## 2015-12-03 DIAGNOSIS — Z7409 Other reduced mobility: Secondary | ICD-10-CM

## 2015-12-03 DIAGNOSIS — M2569 Stiffness of other specified joint, not elsewhere classified: Secondary | ICD-10-CM

## 2015-12-03 DIAGNOSIS — Z789 Other specified health status: Secondary | ICD-10-CM

## 2015-12-03 DIAGNOSIS — M25511 Pain in right shoulder: Secondary | ICD-10-CM
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
## 2015-12-08 ENCOUNTER — Ambulatory Visit (HOSPITAL_COMMUNITY): Payer: Self-pay | Attending: Family Medicine | Admitting: Physical Therapy

## 2015-12-08 DIAGNOSIS — Z7409 Other reduced mobility: Secondary | ICD-10-CM | POA: Insufficient documentation

## 2015-12-08 DIAGNOSIS — M5410 Radiculopathy, site unspecified: Secondary | ICD-10-CM | POA: Insufficient documentation

## 2015-12-08 DIAGNOSIS — M2569 Stiffness of other specified joint, not elsewhere classified: Secondary | ICD-10-CM

## 2015-12-08 DIAGNOSIS — Z658 Other specified problems related to psychosocial circumstances: Secondary | ICD-10-CM | POA: Insufficient documentation

## 2015-12-08 DIAGNOSIS — M25511 Pain in right shoulder: Secondary | ICD-10-CM | POA: Insufficient documentation

## 2015-12-08 DIAGNOSIS — M2560 Stiffness of unspecified joint, not elsewhere classified: Secondary | ICD-10-CM | POA: Insufficient documentation
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
## 2015-12-10 ENCOUNTER — Ambulatory Visit (HOSPITAL_COMMUNITY): Payer: Self-pay | Admitting: Physical Therapy

## 2015-12-10 DIAGNOSIS — M25511 Pain in right shoulder: Secondary | ICD-10-CM

## 2015-12-10 DIAGNOSIS — M2569 Stiffness of other specified joint, not elsewhere classified: Secondary | ICD-10-CM

## 2015-12-10 DIAGNOSIS — M2560 Stiffness of unspecified joint, not elsewhere classified: Secondary | ICD-10-CM

## 2015-12-10 DIAGNOSIS — Z7409 Other reduced mobility: Secondary | ICD-10-CM

## 2015-12-10 DIAGNOSIS — Z789 Other specified health status: Secondary | ICD-10-CM

## 2015-12-10 DIAGNOSIS — M5410 Radiculopathy, site unspecified: Secondary | ICD-10-CM

## 2015-12-10 DIAGNOSIS — R6889 Other general symptoms and signs: Secondary | ICD-10-CM
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
## 2015-12-15 ENCOUNTER — Ambulatory Visit (HOSPITAL_COMMUNITY): Payer: Self-pay | Admitting: Physical Therapy

## 2015-12-15 DIAGNOSIS — R6889 Other general symptoms and signs: Secondary | ICD-10-CM

## 2015-12-15 DIAGNOSIS — M25511 Pain in right shoulder: Secondary | ICD-10-CM

## 2015-12-15 DIAGNOSIS — Z789 Other specified health status: Secondary | ICD-10-CM

## 2015-12-15 DIAGNOSIS — M2569 Stiffness of other specified joint, not elsewhere classified: Secondary | ICD-10-CM

## 2015-12-15 DIAGNOSIS — M5410 Radiculopathy, site unspecified: Secondary | ICD-10-CM

## 2015-12-15 DIAGNOSIS — M2560 Stiffness of unspecified joint, not elsewhere classified: Secondary | ICD-10-CM

## 2015-12-15 DIAGNOSIS — Z7409 Other reduced mobility: Secondary | ICD-10-CM
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
## 2015-12-15 NOTE — Patient Instructions (Signed)
Strengthening: Straight Leg Raise (Phase 1)    Tighten muscles on front of left  thigh, then lift leg 18____ inches from surface, keeping knee locked.  Repeat _10-15___ times per set. Do __1__ sets per session. Do _1___ sessions per day.  http://orth.exer.us/614   Copyright  VHI. All rights reserved.  Strengthening: Hip Abduction (Side-Lying)    Tighten muscles on front of left thigh, then lift leg __15__ inches from surface, keeping knee locked.  Repeat ___10_ times per set. Do __1__ sets per session. Do2 ____ sessions per day.  http://orth.exer.us/622   Copyright  VHI. All rights reserved.  Strengthening: Hip Extension (Prone)    Tighten muscles on front of left thigh, then lift leg _2___ inches from surface, keeping knee locked. Repeat _10___ times per set. Do 1____ sets per session. Do 2____ sessions per day.  http://orth.exer.us/620   Copyright  VHI. All rights reserved.  Strengthening: Hip Adduction (Side-Lying)    Tighten muscles on front of right thigh, then lift leg _2___ inches from surface, keeping knee locked.  Repeat 10-15____ times per set. Do 1____ sets per session. Do _1___ sessions per day.  http://orth.exer.us/624   Copyright  VHI. All rights reserved.  Bridging    Slowly raise buttocks from floor, keeping stomach tight. Repeat _10-15___ times per set. Do ___1_ sets per session. Do __2__ sessions per day.  http://orth.exer.us/1096   Copyright  VHI. All rights reserved.

## 2015-12-17 ENCOUNTER — Encounter (HOSPITAL_COMMUNITY): Payer: 59 | Admitting: Physical Therapy
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
## 2017-02-27 ENCOUNTER — Other Ambulatory Visit (HOSPITAL_COMMUNITY): Payer: Self-pay | Admitting: Family Medicine

## 2017-02-27 DIAGNOSIS — R1310 Dysphagia, unspecified: Secondary | ICD-10-CM

## 2017-03-03 ENCOUNTER — Ambulatory Visit (HOSPITAL_COMMUNITY)
Admission: RE | Admit: 2017-03-03 | Discharge: 2017-03-03 | Disposition: A | Discharge status: Home / Self Care | Payer: BLUE CROSS/BLUE SHIELD | Source: Ambulatory Visit | Attending: Family Medicine | Admitting: Family Medicine

## 2017-03-03 DIAGNOSIS — K449 Diaphragmatic hernia without obstruction or gangrene: Secondary | ICD-10-CM | POA: Diagnosis not present

## 2017-03-03 DIAGNOSIS — R1310 Dysphagia, unspecified: Secondary | ICD-10-CM | POA: Diagnosis present

## 2017-03-03 DIAGNOSIS — K222 Esophageal obstruction: Secondary | ICD-10-CM | POA: Insufficient documentation

## 2018-10-23 DIAGNOSIS — Z6835 Body mass index (BMI) 35.0-35.9, adult: Secondary | ICD-10-CM | POA: Diagnosis not present

## 2018-10-23 DIAGNOSIS — N400 Enlarged prostate without lower urinary tract symptoms: Secondary | ICD-10-CM | POA: Diagnosis not present

## 2018-10-23 DIAGNOSIS — E785 Hyperlipidemia, unspecified: Secondary | ICD-10-CM | POA: Diagnosis not present

## 2018-10-23 DIAGNOSIS — R7309 Other abnormal glucose: Secondary | ICD-10-CM | POA: Diagnosis not present

## 2018-10-23 DIAGNOSIS — M545 Low back pain: Secondary | ICD-10-CM | POA: Diagnosis not present

## 2018-10-23 DIAGNOSIS — E669 Obesity, unspecified: Secondary | ICD-10-CM | POA: Diagnosis not present

## 2018-10-23 DIAGNOSIS — I10 Essential (primary) hypertension: Secondary | ICD-10-CM | POA: Diagnosis not present

## 2018-10-23 DIAGNOSIS — Z125 Encounter for screening for malignant neoplasm of prostate: Secondary | ICD-10-CM | POA: Diagnosis not present

## 2018-11-26 DIAGNOSIS — N400 Enlarged prostate without lower urinary tract symptoms: Secondary | ICD-10-CM | POA: Diagnosis not present

## 2018-11-26 DIAGNOSIS — Z125 Encounter for screening for malignant neoplasm of prostate: Secondary | ICD-10-CM | POA: Diagnosis not present

## 2018-11-26 DIAGNOSIS — Z6835 Body mass index (BMI) 35.0-35.9, adult: Secondary | ICD-10-CM | POA: Diagnosis not present

## 2019-02-25 DIAGNOSIS — E785 Hyperlipidemia, unspecified: Secondary | ICD-10-CM | POA: Diagnosis not present

## 2019-02-25 DIAGNOSIS — I10 Essential (primary) hypertension: Secondary | ICD-10-CM | POA: Diagnosis not present

## 2019-02-25 DIAGNOSIS — Z0000 Encounter for general adult medical examination without abnormal findings: Secondary | ICD-10-CM | POA: Diagnosis not present

## 2019-02-27 DIAGNOSIS — R7309 Other abnormal glucose: Secondary | ICD-10-CM | POA: Diagnosis not present

## 2019-06-24 DIAGNOSIS — I10 Essential (primary) hypertension: Secondary | ICD-10-CM | POA: Diagnosis not present

## 2019-06-24 DIAGNOSIS — R21 Rash and other nonspecific skin eruption: Secondary | ICD-10-CM | POA: Diagnosis not present

## 2019-06-24 DIAGNOSIS — Z7189 Other specified counseling: Secondary | ICD-10-CM | POA: Diagnosis not present

## 2019-06-24 DIAGNOSIS — E785 Hyperlipidemia, unspecified: Secondary | ICD-10-CM | POA: Diagnosis not present

## 2019-07-22 DIAGNOSIS — E785 Hyperlipidemia, unspecified: Secondary | ICD-10-CM | POA: Diagnosis not present

## 2019-07-22 DIAGNOSIS — R21 Rash and other nonspecific skin eruption: Secondary | ICD-10-CM | POA: Diagnosis not present

## 2019-07-22 DIAGNOSIS — I10 Essential (primary) hypertension: Secondary | ICD-10-CM | POA: Diagnosis not present

## 2019-11-25 DIAGNOSIS — I10 Essential (primary) hypertension: Secondary | ICD-10-CM | POA: Diagnosis not present

## 2019-11-25 DIAGNOSIS — E785 Hyperlipidemia, unspecified: Secondary | ICD-10-CM | POA: Diagnosis not present

## 2019-11-25 DIAGNOSIS — R310 Gross hematuria: Secondary | ICD-10-CM | POA: Diagnosis not present

## 2019-11-25 DIAGNOSIS — F5221 Male erectile disorder: Secondary | ICD-10-CM | POA: Diagnosis not present

## 2019-12-09 DIAGNOSIS — R310 Gross hematuria: Secondary | ICD-10-CM | POA: Diagnosis not present

## 2019-12-13 ENCOUNTER — Other Ambulatory Visit: Payer: Self-pay | Admitting: Family Medicine

## 2019-12-13 DIAGNOSIS — R310 Gross hematuria: Secondary | ICD-10-CM

## 2019-12-17 ENCOUNTER — Other Ambulatory Visit: Payer: Self-pay

## 2019-12-17 ENCOUNTER — Ambulatory Visit (HOSPITAL_COMMUNITY)
Admission: RE | Admit: 2019-12-17 | Discharge: 2019-12-17 | Disposition: A | Discharge status: Home / Self Care | Payer: Medicare HMO | Source: Ambulatory Visit | Attending: Family Medicine | Admitting: Family Medicine

## 2019-12-17 DIAGNOSIS — R310 Gross hematuria: Secondary | ICD-10-CM | POA: Diagnosis not present

## 2020-07-06 DIAGNOSIS — I10 Essential (primary) hypertension: Secondary | ICD-10-CM | POA: Diagnosis not present

## 2020-07-06 DIAGNOSIS — M159 Polyosteoarthritis, unspecified: Secondary | ICD-10-CM | POA: Diagnosis not present

## 2020-07-06 DIAGNOSIS — E785 Hyperlipidemia, unspecified: Secondary | ICD-10-CM | POA: Diagnosis not present

## 2020-07-06 DIAGNOSIS — F5221 Male erectile disorder: Secondary | ICD-10-CM | POA: Diagnosis not present

## 2020-08-04 DIAGNOSIS — E785 Hyperlipidemia, unspecified: Secondary | ICD-10-CM | POA: Diagnosis not present

## 2020-08-04 DIAGNOSIS — M170 Bilateral primary osteoarthritis of knee: Secondary | ICD-10-CM | POA: Diagnosis not present

## 2020-08-04 DIAGNOSIS — I10 Essential (primary) hypertension: Secondary | ICD-10-CM | POA: Diagnosis not present

## 2020-10-06 DIAGNOSIS — Z0001 Encounter for general adult medical examination with abnormal findings: Secondary | ICD-10-CM | POA: Diagnosis not present

## 2020-10-06 DIAGNOSIS — F5221 Male erectile disorder: Secondary | ICD-10-CM | POA: Diagnosis not present

## 2020-10-06 DIAGNOSIS — M159 Polyosteoarthritis, unspecified: Secondary | ICD-10-CM | POA: Diagnosis not present

## 2020-10-06 DIAGNOSIS — I10 Essential (primary) hypertension: Secondary | ICD-10-CM | POA: Diagnosis not present

## 2020-10-06 DIAGNOSIS — E785 Hyperlipidemia, unspecified: Secondary | ICD-10-CM | POA: Diagnosis not present

## 2020-12-04 DIAGNOSIS — M170 Bilateral primary osteoarthritis of knee: Secondary | ICD-10-CM | POA: Diagnosis not present

## 2020-12-04 DIAGNOSIS — I10 Essential (primary) hypertension: Secondary | ICD-10-CM | POA: Diagnosis not present

## 2020-12-04 DIAGNOSIS — E785 Hyperlipidemia, unspecified: Secondary | ICD-10-CM | POA: Diagnosis not present

## 2021-02-01 DIAGNOSIS — M170 Bilateral primary osteoarthritis of knee: Secondary | ICD-10-CM | POA: Diagnosis not present

## 2021-02-01 DIAGNOSIS — I10 Essential (primary) hypertension: Secondary | ICD-10-CM | POA: Diagnosis not present

## 2021-02-01 DIAGNOSIS — E785 Hyperlipidemia, unspecified: Secondary | ICD-10-CM | POA: Diagnosis not present

## 2021-02-09 DIAGNOSIS — M159 Polyosteoarthritis, unspecified: Secondary | ICD-10-CM | POA: Diagnosis not present

## 2021-02-09 DIAGNOSIS — F5221 Male erectile disorder: Secondary | ICD-10-CM | POA: Diagnosis not present

## 2021-02-09 DIAGNOSIS — E785 Hyperlipidemia, unspecified: Secondary | ICD-10-CM | POA: Diagnosis not present

## 2021-02-09 DIAGNOSIS — Z7189 Other specified counseling: Secondary | ICD-10-CM | POA: Diagnosis not present

## 2021-02-09 DIAGNOSIS — I10 Essential (primary) hypertension: Secondary | ICD-10-CM | POA: Diagnosis not present

## 2021-03-16 DIAGNOSIS — Z125 Encounter for screening for malignant neoplasm of prostate: Secondary | ICD-10-CM | POA: Diagnosis not present

## 2021-03-16 DIAGNOSIS — Z20828 Contact with and (suspected) exposure to other viral communicable diseases: Secondary | ICD-10-CM | POA: Diagnosis not present

## 2021-03-16 DIAGNOSIS — N400 Enlarged prostate without lower urinary tract symptoms: Secondary | ICD-10-CM | POA: Diagnosis not present

## 2021-03-24 DIAGNOSIS — E7800 Pure hypercholesterolemia, unspecified: Secondary | ICD-10-CM | POA: Diagnosis not present

## 2021-03-24 DIAGNOSIS — I10 Essential (primary) hypertension: Secondary | ICD-10-CM | POA: Diagnosis not present

## 2021-03-24 DIAGNOSIS — H52229 Regular astigmatism, unspecified eye: Secondary | ICD-10-CM | POA: Diagnosis not present

## 2021-05-04 DIAGNOSIS — E785 Hyperlipidemia, unspecified: Secondary | ICD-10-CM | POA: Diagnosis not present

## 2021-05-04 DIAGNOSIS — I10 Essential (primary) hypertension: Secondary | ICD-10-CM | POA: Diagnosis not present

## 2021-05-04 DIAGNOSIS — M170 Bilateral primary osteoarthritis of knee: Secondary | ICD-10-CM | POA: Diagnosis not present

## 2021-06-03 DIAGNOSIS — E785 Hyperlipidemia, unspecified: Secondary | ICD-10-CM | POA: Diagnosis not present

## 2021-06-03 DIAGNOSIS — M170 Bilateral primary osteoarthritis of knee: Secondary | ICD-10-CM | POA: Diagnosis not present

## 2021-06-03 DIAGNOSIS — I10 Essential (primary) hypertension: Secondary | ICD-10-CM | POA: Diagnosis not present

## 2021-08-04 DIAGNOSIS — M170 Bilateral primary osteoarthritis of knee: Secondary | ICD-10-CM | POA: Diagnosis not present

## 2021-08-04 DIAGNOSIS — I10 Essential (primary) hypertension: Secondary | ICD-10-CM | POA: Diagnosis not present

## 2021-08-04 DIAGNOSIS — E785 Hyperlipidemia, unspecified: Secondary | ICD-10-CM | POA: Diagnosis not present

## 2021-08-28 DIAGNOSIS — I10 Essential (primary) hypertension: Secondary | ICD-10-CM | POA: Diagnosis not present

## 2021-08-28 DIAGNOSIS — M159 Polyosteoarthritis, unspecified: Secondary | ICD-10-CM | POA: Diagnosis not present

## 2021-08-28 DIAGNOSIS — E785 Hyperlipidemia, unspecified: Secondary | ICD-10-CM | POA: Diagnosis not present

## 2021-10-04 DIAGNOSIS — I10 Essential (primary) hypertension: Secondary | ICD-10-CM | POA: Diagnosis not present

## 2021-10-04 DIAGNOSIS — E785 Hyperlipidemia, unspecified: Secondary | ICD-10-CM | POA: Diagnosis not present

## 2021-10-04 DIAGNOSIS — M170 Bilateral primary osteoarthritis of knee: Secondary | ICD-10-CM | POA: Diagnosis not present

## 2021-11-23 DIAGNOSIS — Z6836 Body mass index (BMI) 36.0-36.9, adult: Secondary | ICD-10-CM | POA: Diagnosis not present

## 2021-11-23 DIAGNOSIS — E785 Hyperlipidemia, unspecified: Secondary | ICD-10-CM | POA: Diagnosis not present

## 2021-11-23 DIAGNOSIS — I10 Essential (primary) hypertension: Secondary | ICD-10-CM | POA: Diagnosis not present

## 2021-11-23 DIAGNOSIS — M150 Primary generalized (osteo)arthritis: Secondary | ICD-10-CM | POA: Diagnosis not present

## 2022-03-01 DIAGNOSIS — E785 Hyperlipidemia, unspecified: Secondary | ICD-10-CM | POA: Diagnosis not present

## 2022-03-01 DIAGNOSIS — I10 Essential (primary) hypertension: Secondary | ICD-10-CM | POA: Diagnosis not present

## 2022-03-01 DIAGNOSIS — M150 Primary generalized (osteo)arthritis: Secondary | ICD-10-CM | POA: Diagnosis not present

## 2022-03-04 DIAGNOSIS — I10 Essential (primary) hypertension: Secondary | ICD-10-CM | POA: Diagnosis not present

## 2022-03-04 DIAGNOSIS — E785 Hyperlipidemia, unspecified: Secondary | ICD-10-CM | POA: Diagnosis not present

## 2022-04-04 DIAGNOSIS — E785 Hyperlipidemia, unspecified: Secondary | ICD-10-CM | POA: Diagnosis not present

## 2022-04-04 DIAGNOSIS — I10 Essential (primary) hypertension: Secondary | ICD-10-CM | POA: Diagnosis not present

## 2022-04-04 DIAGNOSIS — M150 Primary generalized (osteo)arthritis: Secondary | ICD-10-CM | POA: Diagnosis not present

## 2022-04-26 DIAGNOSIS — R1030 Lower abdominal pain, unspecified: Secondary | ICD-10-CM | POA: Diagnosis not present

## 2022-04-26 DIAGNOSIS — M545 Low back pain, unspecified: Secondary | ICD-10-CM | POA: Diagnosis not present

## 2022-04-26 DIAGNOSIS — J0100 Acute maxillary sinusitis, unspecified: Secondary | ICD-10-CM | POA: Diagnosis not present

## 2022-04-26 DIAGNOSIS — Z0000 Encounter for general adult medical examination without abnormal findings: Secondary | ICD-10-CM | POA: Diagnosis not present

## 2022-08-05 DEATH — deceased
